# Patient Record
Sex: Male | Born: 2000 | Hispanic: Yes | Marital: Single | Smoking: Current every day smoker
Health system: Southern US, Community
[De-identification: ages and names within clinical notes are randomized; demographics above are authoritative.]

---
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
## 2019-01-18 IMAGING — CR DG HAND COMPLETE 3+V*R*
3 series · 3 of 3 positions shown · non-contrast
Comparison: None.

CLINICAL DATA: Injury with swelling across the posterior
metacarpals

EXAM:
RIGHT HAND - COMPLETE 3+ VIEW

[hand pa]
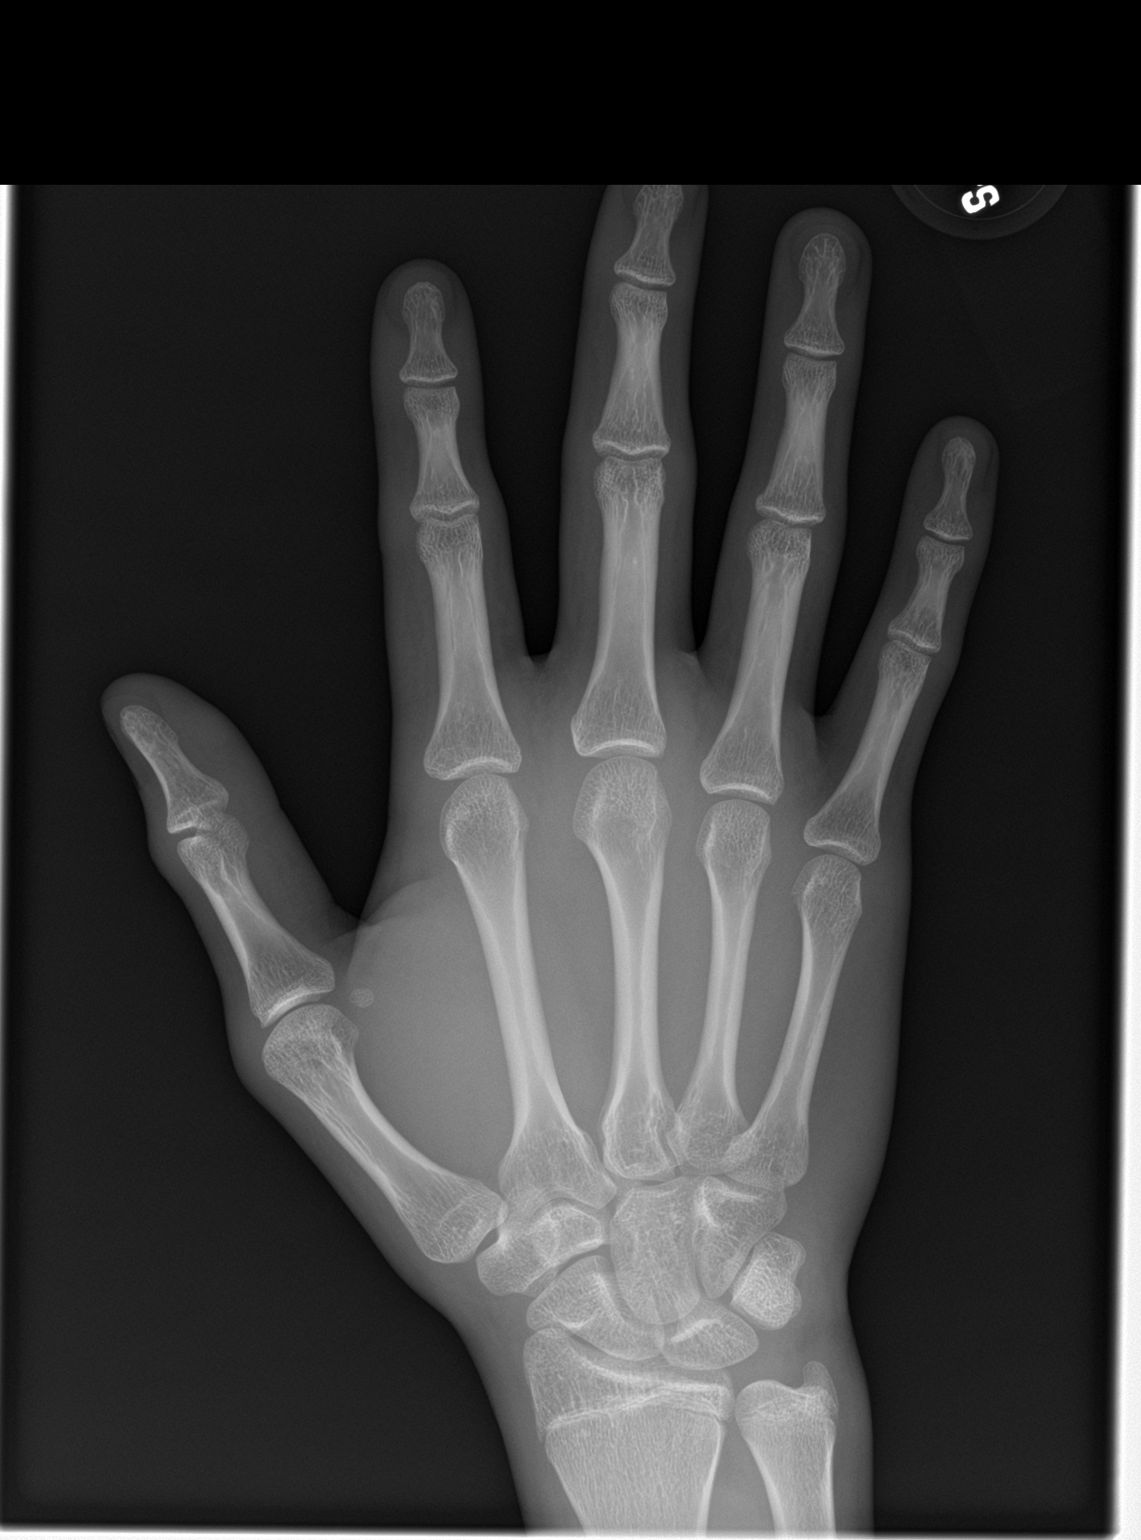

[hand obl]
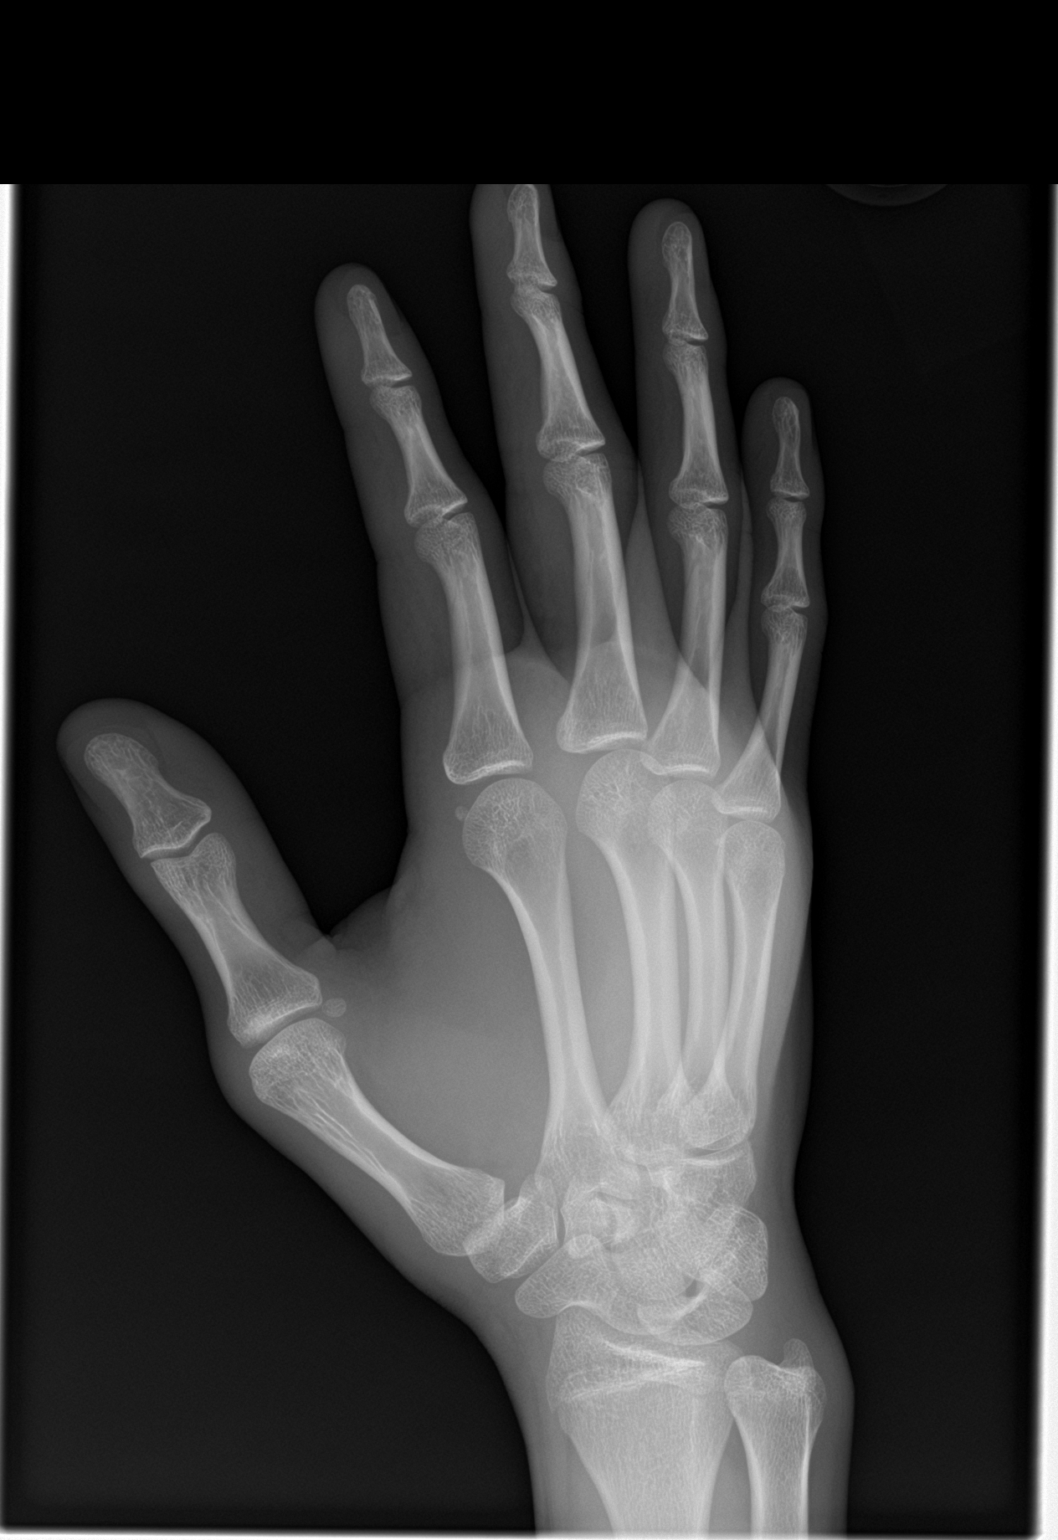

[hand lat]
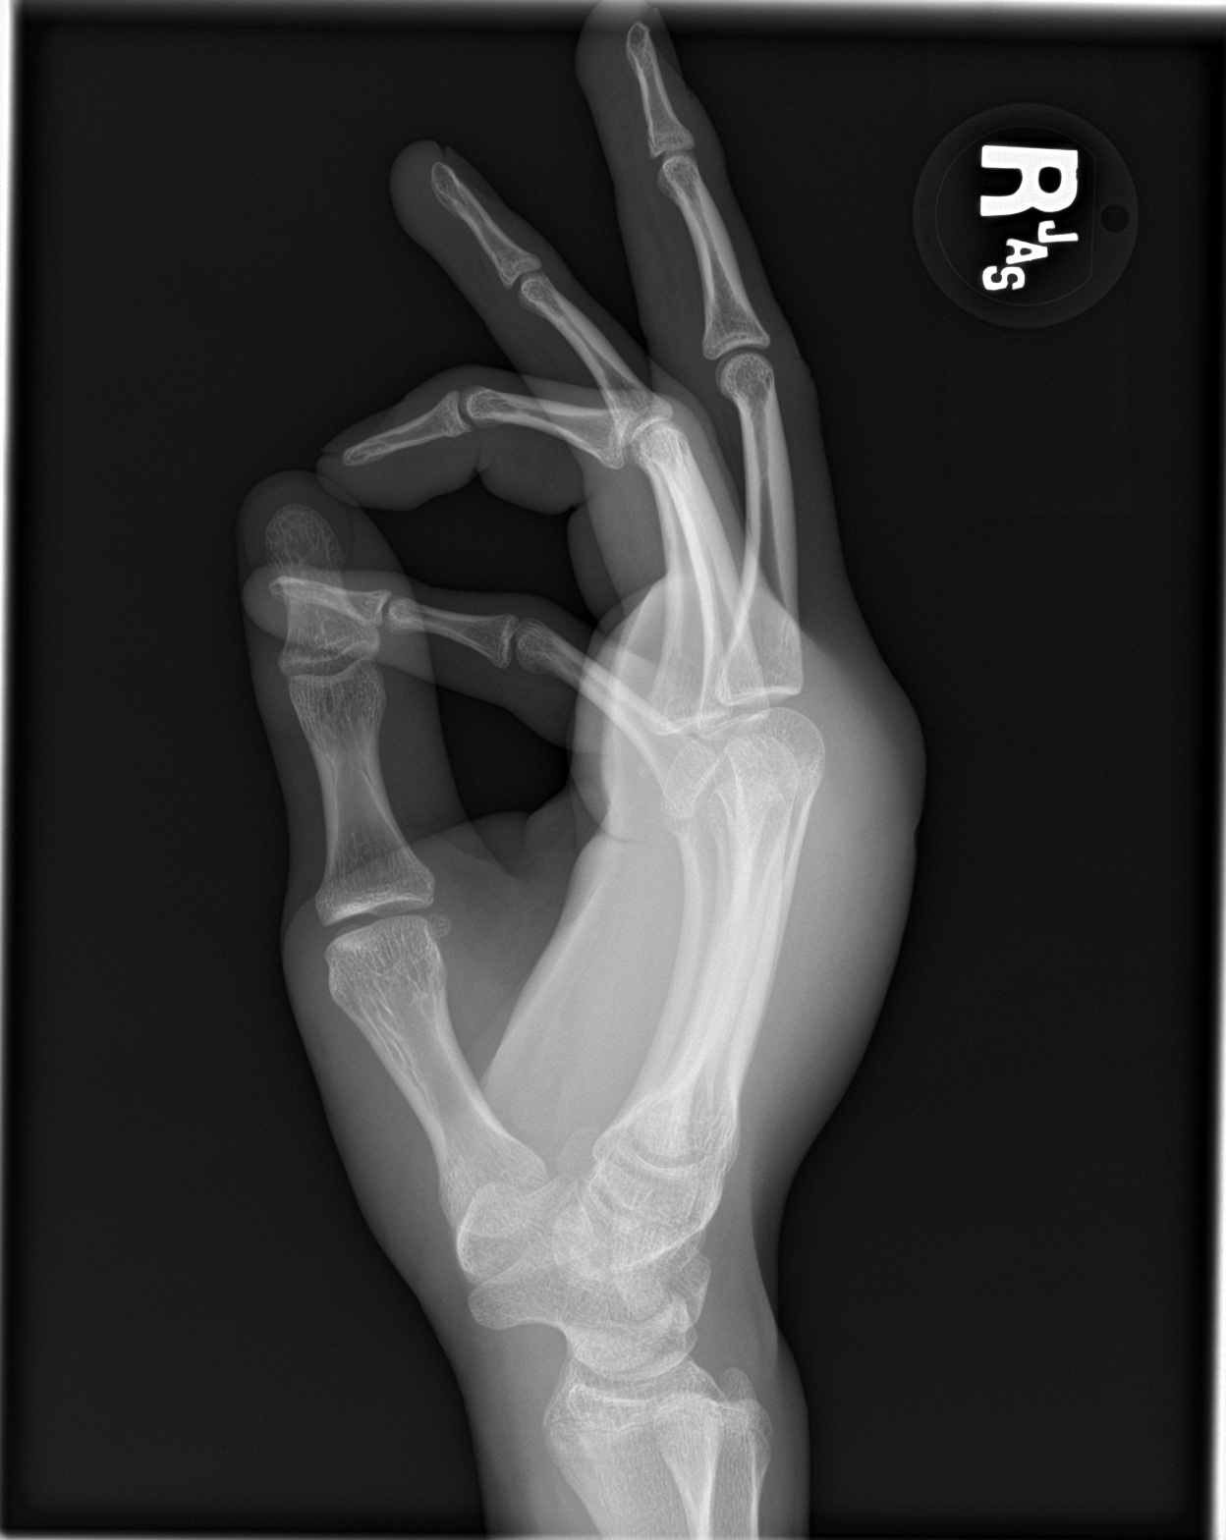

[3 of 3 positions shown; findings below may reference images not displayed]

FINDINGS: Dorsal soft tissue swelling. No definite acute displaced fracture or
malalignment. No radiopaque foreign body.
IMPRESSION: No definite acute osseous abnormality

## 2023-08-24 ENCOUNTER — Encounter (HOSPITAL_COMMUNITY): Payer: Self-pay

## 2023-08-24 ENCOUNTER — Ambulatory Visit (HOSPITAL_COMMUNITY)
Admission: EM | Admit: 2023-08-24 | Discharge: 2023-08-24 | Disposition: A | Discharge status: Home / Self Care | Payer: Self-pay | Attending: Internal Medicine | Admitting: Internal Medicine

## 2023-08-24 ENCOUNTER — Ambulatory Visit (INDEPENDENT_AMBULATORY_CARE_PROVIDER_SITE_OTHER): Payer: Self-pay

## 2023-08-24 DIAGNOSIS — S83412A Sprain of medial collateral ligament of left knee, initial encounter: Secondary | ICD-10-CM

## 2023-08-24 MED ORDER — IBUPROFEN 600 MG PO TABS: 600.0000 mg | ORAL_TABLET | Freq: Four times a day (QID) | ORAL | 0 refills | Status: AC | PRN

## 2023-08-24 NOTE — Discharge Instructions (Addendum)
Apply a compressive ACE bandage. Rest and elevate the affected painful area.  Apply cold compresses intermittently as needed.  As pain recedes, begin normal activities slowly as tolerated.  Please take ibuprofen as recommended Please feel free to return to urgent care if you have persistent symptoms.

## 2023-08-24 NOTE — ED Triage Notes (Signed)
Patient here today with c/o left medial knee pain after jumping a fence and hit croc got caught on the fence and and felt a pop in his left knee Saturday evening at around 8 pm. He has been resting and elevating his leg which seems to help. He Iced it today with some improvement. Bending increased the pain.

## 2023-08-24 NOTE — ED Provider Notes (Signed)
[facility]    CSN: [id number] Arrival date & time: 08/24/23  1617      History   Chief Complaint Chief Complaint  Patient presents with   Knee Pain    HPI Gabriel Riggs is a 22 y.o. male comes to the urgent care with left knee pain which started 2 days ago.  Patient jumped over a fence, lost his balance and then fell resulting in left knee pain.  Patient endorses hearing a loud pop when he fell.  Pain has been persistent since it happened 2 days ago.  Pain is sharp, of moderate severity and aggravated by bearing weight.  No known relieving factors.  Patient has not tried any over-the-counter medications.  No swelling of the knee.  No bruising noted.  No numbness or tingling.  HPI  History reviewed. No pertinent past medical history.  There are no problems to display for this patient.   History reviewed. No pertinent surgical history.     Home Medications    Prior to Admission medications   Medication Sig Start Date End Date Taking? Authorizing Provider  acetaminophen (TYLENOL) 325 MG tablet Take 2 tablets (650 mg total) by mouth every 6 (six) hours as needed for mild pain or moderate pain. 07/11/17   Sherrilee Gilles, NP  ibuprofen (ADVIL) 600 MG tablet Take 1 tablet (600 mg total) by mouth every 6 (six) hours as needed for mild pain or moderate pain. 08/24/23   Pascha Fogal, Britta Mccreedy, MD    Family History History reviewed. No pertinent family history.  Social History Social History   Tobacco Use   Smoking status: Every Day    Types: Cigarettes   Smokeless tobacco: Never  Vaping Use   Vaping status: Never Used  Substance Use Topics   Alcohol use: No   Drug use: Yes    Types: Marijuana     Allergies   Patient has no known allergies.   Review of Systems Review of Systems As per HPI  Physical Exam Triage Vital Signs ED Triage Vitals  Encounter Vitals Group     BP 08/24/23 1727 115/72     Systolic BP Percentile --      Diastolic BP  Percentile --      Pulse Rate 08/24/23 1727 66     Resp 08/24/23 1727 16     Temp 08/24/23 1727 98.3 F (36.8 C)     Temp Source 08/24/23 1727 Oral     SpO2 08/24/23 1727 98 %     Weight 08/24/23 1727 135 lb (61.2 kg)     Height 08/24/23 1727 5\' 9"  (1.753 m)     Head Circumference --      Peak Flow --      Pain Score 08/24/23 1726 5     Pain Loc --      Pain Education --      Exclude from Growth Chart --    No data found.  Updated Vital Signs BP 115/72 (BP Location: Left Arm)   Pulse 66   Temp 98.3 F (36.8 C) (Oral)   Resp 16   Ht 5\' 9"  (1.753 m)   Wt 61.2 kg   SpO2 98%   BMI 19.94 kg/m   Visual Acuity Right Eye Distance:   Left Eye Distance:   Bilateral Distance:    Right Eye Near:   Left Eye Near:    Bilateral Near:     Physical Exam Vitals and nursing note reviewed.  Constitutional:  General: He is not in acute distress.    Appearance: Normal appearance. He is not ill-appearing.  Cardiovascular:     Rate and Rhythm: Normal rate and regular rhythm.  Musculoskeletal:        General: Tenderness present. Normal range of motion.     Comments: Tenderness over the medial aspect of the left knee.  Patient is able to fully extend the left knee.  No bruising noted.  Anterior drawer test is negative.  Neurological:     Mental Status: He is alert.      UC Treatments / Results  Labs (all labs ordered are listed, but only abnormal results are displayed) Labs Reviewed - No data to display  EKG   Radiology No results found.  Procedures Procedures (including critical care time)  Medications Ordered in UC Medications - No data to display  Initial Impression / Assessment and Plan / UC Course  I have reviewed the triage vital signs and the nursing notes.  Pertinent labs & imaging results that were available during my care of the patient were reviewed by me and considered in my medical decision making (see chart for details).     1.  Sprain of the  medial collateral ligament of the left knee: Rest, elevation and icing of the left knee recommended. Ibuprofen 600 mg every 6 hours as needed for pain Left knee brace X-ray of the left knee is negative for fracture.  X-rays were independently reviewed by me. Return precautions given. Final Clinical Impressions(s) / UC Diagnoses   Final diagnoses:  Sprain of medial collateral ligament of left knee, initial encounter     Discharge Instructions      Apply a compressive ACE bandage. Rest and elevate the affected painful area.  Apply cold compresses intermittently as needed.  As pain recedes, begin normal activities slowly as tolerated.  Call if symptoms persist.      ED Prescriptions     Medication Sig Dispense Auth. Provider   ibuprofen (ADVIL) 600 MG tablet Take 1 tablet (600 mg total) by mouth every 6 (six) hours as needed for mild pain or moderate pain. 30 tablet Caelum Federici, Britta Mccreedy, MD      PDMP not reviewed this encounter.   Merrilee Jansky, MD 08/24/23 [phone]

## 2024-08-08 ENCOUNTER — Other Ambulatory Visit: Payer: Self-pay

## 2024-08-08 ENCOUNTER — Emergency Department (HOSPITAL_COMMUNITY): Payer: Self-pay

## 2024-08-08 ENCOUNTER — Emergency Department (HOSPITAL_COMMUNITY)
Admission: EM | Admit: 2024-08-08 | Discharge: 2024-08-08 | Disposition: A | Discharge status: Home / Self Care | Payer: Self-pay | Attending: Emergency Medicine | Admitting: Emergency Medicine

## 2024-08-08 DIAGNOSIS — Z23 Encounter for immunization: Secondary | ICD-10-CM | POA: Insufficient documentation

## 2024-08-08 DIAGNOSIS — T148XXA Other injury of unspecified body region, initial encounter: Secondary | ICD-10-CM

## 2024-08-08 DIAGNOSIS — S91331A Puncture wound without foreign body, right foot, initial encounter: Secondary | ICD-10-CM | POA: Insufficient documentation

## 2024-08-08 DIAGNOSIS — Y9301 Activity, walking, marching and hiking: Secondary | ICD-10-CM | POA: Insufficient documentation

## 2024-08-08 DIAGNOSIS — W450XXA Nail entering through skin, initial encounter: Secondary | ICD-10-CM | POA: Insufficient documentation

## 2024-08-08 MED ORDER — CIPROFLOXACIN HCL 500 MG PO TABS: 500.0000 mg | ORAL_TABLET | Freq: Two times a day (BID) | ORAL | 0 refills | Status: AC

## 2024-08-08 MED ORDER — TETANUS-DIPHTH-ACELL PERTUSSIS 5-2.5-18.5 LF-MCG/0.5 IM SUSY
0.5000 mL | PREFILLED_SYRINGE | Freq: Once | INTRAMUSCULAR | Status: AC
  Administered 2024-08-08 (×2): 0.5 mL via INTRAMUSCULAR
  Filled 2024-08-08: qty 0.5

## 2024-08-08 NOTE — Discharge Instructions (Addendum)
 Take antibiotic 2 times a day for 10 days.  Continue to clean wound daily and keep covered with antibiotic ointment and Band-Aid.  If you see signs of infection follow-up with your primary care or ED.  Signs of infection include redness around the wound, drainage of pus.

## 2024-08-08 NOTE — ED Triage Notes (Signed)
 Pt stepped on a nail yesterday has puncture wound to right foot. Ambulatory but painful to do so. Pt does not believe he has ever had a tetanus shot. Noticed some drainage coming from wound. No fevers

## 2024-08-08 NOTE — ED Provider Notes (Addendum)
 [facility] Provider Note   CSN: [id number] Arrival date & time: 08/08/24  2038     Patient presents with: Wound Check   Gabriel Riggs is a 23 y.o. male.  23 year old male presents to the ED reporting of a puncture wound of the right foot.  Patient reports he was walking in crocs yesterday and stepped on a small rusty nail about 1 inch.  Patient has cleaned the wound extensively and placed antibiotic treatment and Band-Aids.  Patient is requesting antibiotic and tetanus shot because he has never received it.  Patient denies any redness, discharge, shortness of breath, fevers, weakness or neurological pain.    Prior to Admission medications   Medication Sig Start Date End Date Taking? Authorizing Provider  ciprofloxacin  (CIPRO ) 500 MG tablet Take 1 tablet (500 mg total) by mouth every 12 (twelve) hours. 08/08/24  Yes Myriam Fonda RAMAN, PA-C  acetaminophen  (TYLENOL ) 325 MG tablet Take 2 tablets (650 mg total) by mouth every 6 (six) hours as needed for mild pain or moderate pain. 07/11/17   Everlean Laymon SAILOR, NP  ibuprofen  (ADVIL ) 600 MG tablet Take 1 tablet (600 mg total) by mouth every 6 (six) hours as needed for mild pain or moderate pain. 08/24/23   LampteyAleene KIDD, MD    Allergies: Patient has no known allergies.    Review of Systems  Skin:  Positive for wound. Negative for color change, pallor and rash.  All other systems reviewed and are negative.   Updated Vital Signs BP 130/84   Pulse 68   Temp 98.2 F (36.8 C) (Oral)   Resp 18   Ht 5' 9 (1.753 m)   Wt 63.5 kg   SpO2 100%   BMI 20.67 kg/m   Physical Exam Vitals and nursing note reviewed.  Constitutional:      Appearance: Normal appearance.  HENT:     Head: Normocephalic and atraumatic.     Nose: Nose normal.  Eyes:     Extraocular Movements: Extraocular movements intact.     Conjunctiva/sclera: Conjunctivae normal.     Pupils: Pupils are equal, round, and reactive  to light.  Cardiovascular:     Rate and Rhythm: Normal rate.  Pulmonary:     Effort: Pulmonary effort is normal. No respiratory distress.  Musculoskeletal:        General: No deformity. Normal range of motion.     Cervical back: Normal range of motion.     Left foot: Normal.     Comments: Puncture wound to heel of right foot   Skin:    General: Skin is warm and dry.  Neurological:     General: No focal deficit present.     Mental Status: He is alert.  Psychiatric:        Mood and Affect: Mood normal.        Behavior: Behavior normal.     (all labs ordered are listed, but only abnormal results are displayed) Labs Reviewed - No data to display  EKG: None  Radiology: DG Foot Complete Right Result Date: 08/08/2024 CLINICAL DATA:  Puncture wound, stepped on a nail yesterday.  Pain. EXAM: RIGHT FOOT COMPLETE - 3+ VIEW COMPARISON:  None Available. FINDINGS: There is no evidence of fracture or dislocation. Alignment and joint spaces are normal. No erosions or bone destruction. Punctate radiopaque densities project over the plantar aspect of the foot at the level of the metatarsal phalangeal joints, potentially at the 3rd-4th interspace. IMPRESSION:  1. Punctate radiopaque densities project over the plantar aspect of the foot at the level of the metatarsophalangeal joints, potentially at the 3rd-4th interspace. 2. No acute osseous abnormality. Electronically Signed   By: Andrea Gasman M.D.   On: 08/08/2024 23:10     Procedures   Medications Ordered in the ED  Tdap (BOOSTRIX ) injection 0.5 mL (0.5 mLs Intramuscular Given 08/08/24 2356)                                 Medical Decision Making Amount and/or Complexity of Data Reviewed Radiology: ordered.  Risk Prescription drug management.  23 y.o. male presents to the ED for concern of Wound Check     This involves an extensive number of treatment options, and is a complaint that carries with it a high risk of complications and  morbidity.  The differential diagnosis prior to evaluation includes, but is not limited to: Cellulitis, local infection, retained foreign body in wound, fracture.  This is not an exhaustive differential.   Past Medical History / Co-morbidities / Social History: No known medical history. Social Determinants of Health include: Patient reports no alcohol use and tobacco use.   Imaging Studies: I ordered imaging studies including x-ray of the right foot..   I independently visualized and interpreted imaging which showed no retained foreign body at wound site on the right calcaneus. Radiologist noted radiopaque retained body at the 3rd and 4th metatarsal.  No pain, wound, or complaints in this area. I agree with the radiologist interpretation.   ED Course / Critical Interventions: Pt well-appearing on exam.  Patient sitting nontoxic-appearing in the ED bed.  Patient reporting puncture wound yesterday in the right heel.  On assessment of injury pain has appropriate capillary refill, no nerve pain, and 1 out of 10 pain on palpation to wound site.  No obvious signs of infection at wound site and patient has cleaned the wound well.  Patient is concerned with infection.  Patient was advised to continue to clean wound site and use prescribed antibiotics.  Patient was given tetanus booster because he has not received a tetanus shot.  Patient reported he had no questions and was advised to return if signs of infection began at the wound site.  Disposition: Considered admission and after reviewing the patient's encounter today, I feel that the patient would benefit from discharge and outpatient antibiotics.  Discussed course of treatment with the patient, whom demonstrated understanding.  Patient in agreement and has no further questions.    I discussed this case with my attending, Dr. Cleotilde, who agreed with the proposed treatment course and cosigned this note including patient's presenting symptoms,  physical exam, and planned diagnostics and interventions.  Attending physician stated agreement with plan or made changes to plan which were implemented.     This chart was dictated using voice recognition software.  Despite best efforts to proofread, errors can occur which can change the documentation meaning.    Final diagnoses:  Puncture wound    ED Discharge Orders          Ordered    ciprofloxacin  (CIPRO ) 500 MG tablet  Every 12 hours        08/08/24 2304               Myriam Fonda GORMAN DEVONNA 08/08/24 2321    Cleotilde Rogue, MD 08/09/24 1507    Myriam Fonda GORMAN, [place] 08/09/24 1543  Cleotilde Rogue, MD 08/11/24 [phone]
